# Patient Record
Sex: Female | Born: 1935
Health system: Southern US, Community
[De-identification: ages and names within clinical notes are randomized; demographics above are authoritative.]

## PROBLEM LIST (undated history)

## (undated) DIAGNOSIS — I2510 Atherosclerotic heart disease of native coronary artery without angina pectoris: Secondary | ICD-10-CM

## (undated) DIAGNOSIS — I421 Obstructive hypertrophic cardiomyopathy: Secondary | ICD-10-CM

## (undated) DIAGNOSIS — D649 Anemia, unspecified: Secondary | ICD-10-CM

## (undated) DIAGNOSIS — N289 Disorder of kidney and ureter, unspecified: Secondary | ICD-10-CM

## (undated) DIAGNOSIS — E079 Disorder of thyroid, unspecified: Secondary | ICD-10-CM

## (undated) HISTORY — PX: CARDIAC SURGERY: SHX584

## (undated) HISTORY — PX: EYE SURGERY: SHX253

## (undated) HISTORY — PX: BREAST SURGERY: SHX581

---

## 2018-10-13 ENCOUNTER — Other Ambulatory Visit: Payer: Self-pay

## 2018-10-13 ENCOUNTER — Encounter: Payer: Self-pay | Admitting: Emergency Medicine

## 2018-10-13 ENCOUNTER — Emergency Department
Admission: EM | Admit: 2018-10-13 | Discharge: 2018-10-13 | Disposition: A | Discharge status: Home / Self Care | Payer: Medicare HMO | Source: Home / Self Care

## 2018-10-13 DIAGNOSIS — R300 Dysuria: Secondary | ICD-10-CM

## 2018-10-13 DIAGNOSIS — R3129 Other microscopic hematuria: Secondary | ICD-10-CM

## 2018-10-13 HISTORY — DX: Atherosclerotic heart disease of native coronary artery without angina pectoris: I25.10

## 2018-10-13 HISTORY — DX: Disorder of kidney and ureter, unspecified: N28.9

## 2018-10-13 HISTORY — DX: Anemia, unspecified: D64.9

## 2018-10-13 HISTORY — DX: Obstructive hypertrophic cardiomyopathy: I42.1

## 2018-10-13 HISTORY — DX: Disorder of thyroid, unspecified: E07.9

## 2018-10-13 LAB — POCT URINALYSIS DIP (MANUAL ENTRY)
Bilirubin, UA: NEGATIVE
Glucose, UA: NEGATIVE mg/dL
Ketones, POC UA: NEGATIVE mg/dL
Nitrite, UA: NEGATIVE
Protein Ur, POC: NEGATIVE mg/dL
Spec Grav, UA: <=1.005 — AB (ref 1.010–1.025)
Urobilinogen, UA: 0.2 E.U./dL
pH, UA: 5 (ref 5.0–8.0)

## 2018-10-13 NOTE — Discharge Instructions (Signed)
°  Your urine sample has been sent to the lab for further testing to see if any additional bacteria grows and if antibiotics are indicated or not. Be sure to stay well hydrated.  You may try over the counter medication called Azo to help with bladder spasms.  This medication can make your urine orange, which is normal.   Please follow up with family medicine in 3-4 days if symptoms not improving, sooner if worsening.

## 2018-10-13 NOTE — ED Triage Notes (Signed)
Patient states she has some discomfort upon urination since yesterday. It does not feel like previous UTIs.  She has not taken any OTCs. She has not travelled in past 4 weeks.

## 2018-10-13 NOTE — ED Provider Notes (Signed)
[facility]    CSN: [id number] Arrival date & time: 10/13/18  1046     History   Chief Complaint Chief Complaint  Patient presents with  . Dysuria    HPI Susan Parker is a 85 y.o. female.   HPI Susan Parker is a 85 y.o. female presenting to UC with c/o mild tingling with urination that started yesterday.  Usually she has pain and frequency with UTIs but her last UTI was many years ago.  Denies fever, chills, n/v/d. Denies abdominal pain or back pain. She has not tried anything for her symptoms.    Past Medical History:  Diagnosis Date  . Anemia   . Coronary artery disease   . IHSS (idiopathic hypertrophic subaortic stenosis) (HCC)   . Renal disorder   . Thyroid disease     There are no active problems to display for this patient.   Past Surgical History:  Procedure Laterality Date  . BREAST SURGERY    . CARDIAC SURGERY    . EYE SURGERY      OB History   No obstetric history on file.      Home Medications    Prior to Admission medications   Medication Sig Start Date End Date Taking? Authorizing Provider  aspirin 81 MG chewable tablet Chew by mouth. 06/20/18 06/20/19 Yes [provider]  carvedilol (COREG) 6.25 MG tablet Take by mouth. 09/12/18  Yes [provider]  citalopram (CELEXA) 20 MG tablet  01/27/14  Yes [provider]  dronedarone (MULTAQ) 400 MG tablet TAKE 1 TABLET EVERY 12 HOURS 05/26/18  Yes [provider]  furosemide (LASIX) 40 MG tablet Take by mouth. 09/12/18  Yes [provider]  levothyroxine (SYNTHROID) 50 MCG tablet TAKE 1 TABLET EVERY DAY 01/27/14  Yes [provider]  losartan (COZAAR) 25 MG tablet  01/27/14  Yes [provider]  lovastatin (MEVACOR) 40 MG tablet TAKE 1 TABLET AT BEDTIME 09/29/18  Yes [provider]  mirtazapine (REMERON) 15 MG tablet Take one tablet (15 mg dose) by mouth at bedtime. 07/21/18  Yes [provider]  potassium chloride  (MICRO-K) 10 MEQ CR capsule Take by mouth. 02/25/14  Yes [provider]  promethazine (PHENERGAN) 12.5 MG tablet Take by mouth. 02/27/17  Yes [provider]  sacubitril-valsartan (ENTRESTO) 24-26 MG Take by mouth. 08/15/18  Yes [provider]  sertraline (ZOLOFT) 25 MG tablet Take by mouth. 09/30/18  Yes [provider]  warfarin (COUMADIN) 5 MG tablet TAKE 1 TABLET BY MOUTH DAILY 11/18/17  Yes [provider]  Cholecalciferol (VITAMIN D-1000 MAX ST) 25 MCG (1000 UT) tablet Take by mouth.    [provider]  Multiple Vitamin (MULTIVITAMIN) capsule Take by mouth.    [provider]  omeprazole (PRILOSEC) 20 MG capsule Take by mouth.    [provider]  vitamin B-12 (CYANOCOBALAMIN) 1000 MCG tablet Take by mouth.    [provider]  VITAMIN D, CHOLECALCIFEROL, PO Take by mouth.    [provider]    Family History No family history on file.  Social History Social History   Tobacco Use  . Smoking status: Not on file  Substance Use Topics  . Alcohol use: Not on file  . Drug use: Not on file     Allergies   Diltiazem; Prednisone; Ace inhibitors; Atorvastatin calcium; Diazepam; Nitroglycerin; Penicillins; Propoxyphene; Rosuvastatin calcium; Simvastatin; Diltiazem hcl; Penicillin g; Statins; and Codeine   Review of Systems Review of Systems  Physical Exam Triage Vital Signs ED Triage Vitals  Enc Vitals Group     BP 10/13/18 1120 135/77     Pulse Rate 10/13/18 1120 84     Resp 10/13/18 1120 16     Temp 10/13/18 1120 98.8 F (37.1 C)     Temp Source 10/13/18 1120 Oral     SpO2 10/13/18 1120 97 %     Weight 10/13/18 1121 112 lb (50.8 kg)     Height 10/13/18 1121 5' (1.524 m)     Head Circumference --      Peak Flow --      Pain Score 10/13/18 1121 5     Pain Loc --      Pain Edu? --      Excl. in GC? --    No data found.  Updated Vital Signs BP 135/77 (BP Location: Right Arm)    Pulse 84   Temp 98.8 F (37.1 C) (Oral)   Resp 16   Ht 5' (1.524 m)   Wt 112 lb (50.8 kg)   SpO2 97%   BMI 21.87 kg/m   Visual Acuity Right Eye Distance:   Left Eye Distance:   Bilateral Distance:    Right Eye Near:   Left Eye Near:    Bilateral Near:     Physical Exam Vitals signs and nursing note reviewed.  Constitutional:      Appearance: Normal appearance. She is well-developed.  HENT:     Head: Normocephalic and atraumatic.     Nose: Nose normal.     Mouth/Throat:     Mouth: Mucous membranes are moist.  Eyes:     Extraocular Movements: Extraocular movements intact.  Neck:     Musculoskeletal: Normal range of motion.  Cardiovascular:     Rate and Rhythm: Normal rate and regular rhythm.  Pulmonary:     Effort: Pulmonary effort is normal.     Breath sounds: Normal breath sounds.  Abdominal:     General: There is no distension.     Palpations: Abdomen is soft.     Tenderness: There is no abdominal tenderness. There is no right CVA tenderness or left CVA tenderness.  Musculoskeletal: Normal range of motion.  Skin:    General: Skin is warm and dry.  Neurological:     Mental Status: She is alert and oriented to person, place, and time.  Psychiatric:        Behavior: Behavior normal.      UC Treatments / Results  Labs (all labs ordered are listed, but only abnormal results are displayed) Labs Reviewed  POCT URINALYSIS DIP (MANUAL ENTRY) - Abnormal; Notable for the following components:      Result Value   Color, UA light yellow (*)    Spec Grav, UA <=1.005 (*)    Blood, UA moderate (*)    Leukocytes, UA Small (1+) (*)    All other components within normal limits  URINE CULTURE    EKG None  Radiology No results found.  Procedures Procedures (including critical care time)  Medications Ordered in UC Medications - No data to display  Initial Impression / Assessment and Plan / UC Course  I have reviewed the triage vital signs and the nursing  notes.  Pertinent labs & imaging results that were available during my care of the patient were reviewed by me and considered in my medical decision making (see chart for details).     Pt appears well, NAD Only small leukocytes in  UA, no nitrites. Will hold off on antibiotic and wait for urine culture to result to determine if/which antibiotic is indicated. May try OTC Azo in meantime, good hydration F/u with PCP as needed AVS provided   Final Clinical Impressions(s) / UC Diagnoses   Final diagnoses:  Dysuria  Hematuria, microscopic     Discharge Instructions      Your urine sample has been sent to the lab for further testing to see if any additional bacteria grows and if antibiotics are indicated or not. Be sure to stay well hydrated.  You may try over the counter medication called Azo to help with bladder spasms.  This medication can make your urine orange, which is normal.   Please follow up with family medicine in 3-4 days if symptoms not improving, sooner if worsening.      ED Prescriptions    None     Controlled Substance Prescriptions [place] Controlled Substance Registry consulted? Not Applicable   Rolla Plate 10/13/18 1610

## 2018-10-15 ENCOUNTER — Telehealth: Payer: Self-pay

## 2018-10-15 LAB — URINE CULTURE
MICRO NUMBER:: 504776
SPECIMEN QUALITY:: ADEQUATE

## 2018-10-15 MED ORDER — CEPHALEXIN 250 MG PO CAPS: ORAL_CAPSULE | ORAL | 0 refills | Status: DC

## 2018-10-15 NOTE — Telephone Encounter (Signed)
Urine Culture positive e. Coli, sensitive to Keflex. eGFR 45; will RX Keflex '250mg'$  Q8hr

## 2018-10-15 NOTE — Telephone Encounter (Signed)
Notified patient of positive UCX results.  Medication sent to [facility] pharmacy.

## 2021-05-24 ENCOUNTER — Emergency Department (INDEPENDENT_AMBULATORY_CARE_PROVIDER_SITE_OTHER)
Admission: EM | Admit: 2021-05-24 | Discharge: 2021-05-24 | Disposition: A | Discharge status: Home / Self Care | Payer: Medicare HMO | Source: Home / Self Care

## 2021-05-24 ENCOUNTER — Ambulatory Visit: Payer: Self-pay

## 2021-05-24 ENCOUNTER — Other Ambulatory Visit: Payer: Self-pay

## 2021-05-24 ENCOUNTER — Emergency Department (INDEPENDENT_AMBULATORY_CARE_PROVIDER_SITE_OTHER): Payer: Medicare HMO

## 2021-05-24 DIAGNOSIS — R5383 Other fatigue: Secondary | ICD-10-CM | POA: Diagnosis not present

## 2021-05-24 DIAGNOSIS — R059 Cough, unspecified: Secondary | ICD-10-CM | POA: Diagnosis not present

## 2021-05-24 DIAGNOSIS — R110 Nausea: Secondary | ICD-10-CM

## 2021-05-24 MED ORDER — ONDANSETRON 4 MG PO TBDP: 4.0000 mg | ORAL_TABLET | Freq: Three times a day (TID) | ORAL | 0 refills | Status: AC | PRN

## 2021-05-24 MED ORDER — BENZONATATE 200 MG PO CAPS: 200.0000 mg | ORAL_CAPSULE | Freq: Three times a day (TID) | ORAL | 0 refills | Status: AC | PRN

## 2021-05-24 MED ORDER — CEFDINIR 300 MG PO CAPS
300.0000 mg | ORAL_CAPSULE | Freq: Two times a day (BID) | ORAL | 0 refills | Status: AC
Start: 2021-05-24 — End: 2021-05-31

## 2021-05-24 NOTE — Discharge Instructions (Addendum)
Advised patient chest x-ray was negative for acute cardiopulmonary process.  Advised patient to take medication as directed with food to completion.  Advised patient may take OTC Claritin or Zyrtec 10 mg without D with first dose of Cefdinir for the next 5 of 7 days for concurrent postnasal drainage/drip.  Advised may use Tessalon Perles daily or as needed for cough.  Advised patient may use Zofran daily or as needed for nausea.  Medication prescriptions have been tonight per patient's request.

## 2021-05-24 NOTE — ED Provider Notes (Signed)
[facility]    CSN: [id number] Arrival date & time: 05/24/21  1647      History   Chief Complaint Chief Complaint  Patient presents with   Cough    Cough, chest congestion and fatigue. X 2weeks    HPI Kaitlynd Yoh is a 88 y.o. female.   HPI 88 year old female presents with cough, chest congestion and fatigue for 2 weeks.  PMH significant for IHSS (currently on Coumadin), anemia, CAD, and renal disorder.  Accompanied by her daughter this evening.  Additionally, patient reports intermittent nausea when coughing.  Past Medical History:  Diagnosis Date   Anemia    Coronary artery disease    IHSS (idiopathic hypertrophic subaortic stenosis) (HCC)    Renal disorder    Thyroid disease     There are no problems to display for this patient.   Past Surgical History:  Procedure Laterality Date   BREAST SURGERY     CARDIAC SURGERY     EYE SURGERY      OB History   No obstetric history on file.      Home Medications    Prior to Admission medications   Medication Sig Start Date End Date Taking? Authorizing Provider  carvedilol (COREG) 6.25 MG tablet Take by mouth. 09/12/18  Yes [provider]  cephALEXin (KEFLEX) 250 MG capsule Take one cap PO Q8 hr. 10/15/18  Yes Kandra Nicolas, MD  Cholecalciferol 25 MCG (1000 UT) tablet Take by mouth.   Yes [provider]  dicyclomine (BENTYL) 20 MG tablet Take 20 mg by mouth 3 (three) times daily as needed. 05/16/21  Yes [provider]  dronedarone (MULTAQ) 400 MG tablet TAKE 1 TABLET EVERY 12 HOURS 05/26/18  Yes [provider]  famotidine (PEPCID) 40 MG tablet Take by mouth. 12/28/20  Yes [provider]  furosemide (LASIX) 40 MG tablet Take by mouth. 09/12/18  Yes [provider]  levothyroxine (SYNTHROID) 75 MCG tablet Take 1 tablet by mouth at bedtime. 05/26/20  Yes [provider]  losartan (COZAAR) 25 MG tablet  01/27/14  Yes [provider]   lovastatin (MEVACOR) 40 MG tablet TAKE 1 TABLET AT BEDTIME 09/29/18  Yes [provider]  metoprolol (TOPROL-XL) 200 MG 24 hr tablet Take 200 mg by mouth daily. 05/08/21  Yes [provider]  mirtazapine (REMERON) 15 MG tablet Take one tablet (15 mg dose) by mouth at bedtime. 07/21/18  Yes [provider]  Multiple Vitamin (MULTIVITAMIN) capsule Take by mouth.   Yes [provider]  Omega-3 Fatty Acids (FISH OIL) 1000 MG CAPS Take by mouth.   Yes [provider]  omeprazole (PRILOSEC) 20 MG capsule Take by mouth.   Yes [provider]  potassium chloride (MICRO-K) 10 MEQ CR capsule Take by mouth. 02/25/14  Yes [provider]  promethazine (PHENERGAN) 12.5 MG tablet Take by mouth. 02/27/17  Yes [provider]  sacubitril-valsartan (ENTRESTO) 24-26 MG Take by mouth. 08/15/18  Yes [provider]  sacubitril-valsartan (ENTRESTO) 24-26 MG Take by mouth. 11/29/20  Yes [provider]  sertraline (ZOLOFT) 100 MG tablet Take 1 tablet by mouth daily. 02/27/21  Yes [provider]  sertraline (ZOLOFT) 25 MG tablet Take by mouth. 09/30/18  Yes [provider]  spironolactone (ALDACTONE) 25 MG tablet Take 1 tablet by mouth daily. 01/25/21  Yes [provider]  vitamin B-12 (CYANOCOBALAMIN) 1000 MCG tablet Take by mouth.   Yes [provider]  VITAMIN D,  CHOLECALCIFEROL, PO Take by mouth.   Yes [provider]  warfarin (COUMADIN) 5 MG tablet TAKE 1 TABLET BY MOUTH DAILY 11/18/17  Yes [provider]  citalopram (CELEXA) 20 MG tablet  01/27/14   [provider]  levothyroxine (SYNTHROID) 50 MCG tablet TAKE 1 TABLET EVERY DAY 01/27/14   [provider]    Family History History reviewed. No pertinent family history.  Social History Social History   Tobacco Use   Smoking status: Never   Smokeless tobacco: Never     Allergies   Diltiazem, Prednisone,  Ace inhibitors, Atorvastatin calcium, Diazepam, Nitroglycerin, Penicillins, Propoxyphene, Rosuvastatin calcium, Simvastatin, Diltiazem hcl, Penicillin g, Statins, and Codeine   Review of Systems Review of Systems  Constitutional:  Positive for fatigue.  HENT:  Positive for congestion.   Respiratory:  Positive for cough.   All other systems reviewed and are negative.   Physical Exam Triage Vital Signs ED Triage Vitals  Enc Vitals Group     BP 05/24/21 1742 110/67     Pulse Rate 05/24/21 1742 73     Resp 05/24/21 1742 16     Temp 05/24/21 1742 97.9 F (36.6 C)     Temp Source 05/24/21 1742 Oral     SpO2 05/24/21 1747 99 %     Weight 05/24/21 1739 91 lb (41.3 kg)     Height 05/24/21 1739 5' (1.524 m)     Head Circumference --      Peak Flow --      Pain Score 05/24/21 1739 0     Pain Loc --      Pain Edu? --      Excl. in [place]? --    No data found.  Updated Vital Signs BP 110/67 (BP Location: Right Arm)    Pulse 73    Temp 97.9 F (36.6 C) (Oral)    Resp 16    Ht 5' (1.524 m)    Wt 91 lb (41.3 kg)    SpO2 99%    BMI 17.77 kg/m   Physical Exam Vitals and nursing note reviewed.  Constitutional:      General: She is not in acute distress.    Appearance: Normal appearance. She is not ill-appearing.     Comments: Very thin in stature  HENT:     Right Ear: Tympanic membrane, ear canal and external ear normal.     Left Ear: Tympanic membrane, ear canal and external ear normal.     Mouth/Throat:     Mouth: Mucous membranes are moist.     Pharynx: Oropharynx is clear.  Eyes:     Extraocular Movements: Extraocular movements intact.     Conjunctiva/sclera: Conjunctivae normal.     Pupils: Pupils are equal, round, and reactive to light.  Cardiovascular:     Rate and Rhythm: Normal rate and regular rhythm.     Pulses: Normal pulses.     Heart sounds: Normal heart sounds.  Pulmonary:     Comments: Diminished breath sounds noted throughout. Musculoskeletal:     Cervical back:  No tenderness.  Lymphadenopathy:     Cervical: No cervical adenopathy.  Skin:    General: Skin is warm and dry.  Neurological:     General: No focal deficit present.     Mental Status: She is alert and oriented to person, place, and time.     UC Treatments / Results  Labs (all labs ordered are listed, but only abnormal results are displayed) Labs Reviewed -  No data to display  EKG   Radiology DG Chest 2 View  Result Date: 05/24/2021 CLINICAL DATA:  Diminished breath sounds, cough. EXAM: CHEST - 2 VIEW FINDINGS: Left-sided ICD is present. The heart is enlarged, unchanged. The lungs are clear there is no pleural effusion or pneumothorax. No acute fractures are seen. IMPRESSION: 1. Cardiomegaly. 2. No acute cardiopulmonary process. Electronically Signed   By: Ronney Asters M.D.   On: 05/24/2021 18:44    Procedures Procedures (including critical care time)  Medications Ordered in UC Medications - No data to display  Initial Impression / Assessment and Plan / UC Course  I have reviewed the triage vital signs and the nursing notes.  Pertinent labs & imaging results that were available during my care of the patient were reviewed by me and considered in my medical decision making (see chart for details).     MDM: 1. Cough-CXR revealed above, cardiomegaly, no acute cardiopulmonary process. Advised patient chest x-ray was negative for acute cardiopulmonary process.  Advised patient to take medication as directed with food to completion.  Advised patient may take OTC Claritin or Zyrtec 10 mg without D with first dose of Cefdinir for the next 5 of 7 days for concurrent postnasal drainage/drip.  Advised may use Tessalon Perles daily or as needed for cough.  Advised patient may use Zofran daily or as needed for nausea.  Medication prescriptions have been tonight per patient's request.  Patient discharged home, hemodynamically stable. Final Clinical Impressions(s) / UC Diagnoses   Final  diagnoses:  Cough, unspecified type  Fatigue, unspecified type   Discharge Instructions   None    ED Prescriptions   None    PDMP not reviewed this encounter.   Eliezer Lofts, [place] 05/24/21 1930

## 2021-05-24 NOTE — ED Triage Notes (Signed)
Pt states that she has a cough, chest congestion and fatigue. X2 weeks  Pt states that she is vaccinated for covid. Pt states that she has had flu vaccine.

## 2021-08-28 ENCOUNTER — Encounter: Payer: Self-pay | Admitting: Emergency Medicine

## 2021-08-28 ENCOUNTER — Emergency Department
Admission: EM | Admit: 2021-08-28 | Discharge: 2021-08-28 | Disposition: A | Discharge status: Home / Self Care | Payer: Medicare HMO | Source: Home / Self Care | Attending: Family Medicine | Admitting: Family Medicine

## 2021-08-28 DIAGNOSIS — W19XXXA Unspecified fall, initial encounter: Secondary | ICD-10-CM | POA: Diagnosis not present

## 2021-08-28 DIAGNOSIS — S51819A Laceration without foreign body of unspecified forearm, initial encounter: Secondary | ICD-10-CM

## 2021-08-28 DIAGNOSIS — Y92009 Unspecified place in unspecified non-institutional (private) residence as the place of occurrence of the external cause: Secondary | ICD-10-CM

## 2021-08-28 MED ORDER — LIDOCAINE-EPINEPHRINE-TETRACAINE (LET) TOPICAL GEL
3.0000 mL | Freq: Once | TOPICAL | Status: AC
  Administered 2021-08-28: 3 mL via TOPICAL

## 2021-08-28 MED ORDER — LIDOCAINE-EPINEPHRINE-TETRACAINE (LET) TOPICAL GEL: 3.0000 mL | Freq: Once | TOPICAL | Status: DC

## 2021-08-28 NOTE — ED Triage Notes (Signed)
Pt states she was walking down hall and bumped her arm on wall and has a laceration to her left forearm. This happened about 2 hour ago. She is on blood thinners. Dressing in place.  ?

## 2021-08-28 NOTE — Discharge Instructions (Signed)
Keep area clean and dry ?Watch for infection ?Protect tapes for 5-7 days ?Remove gently ?Re apply if needed ?

## 2021-08-28 NOTE — ED Provider Notes (Signed)
?[facility] ? ? ? ?CSN: [id number] ?Arrival date & time: 08/28/21  1800 ? ? ?  ? ?History   ?Chief Complaint ?Chief Complaint  ?Patient presents with  ? Extremity Laceration  ? ? ?HPI ?Susan Parker is a 88 y.o. female.  ? ?HPI ? ?Patient took off walking without her walker had some items in her hand, lost her balance and fell backwards.  Hit her right arm on furniture.  Here for a skin tear.  Did not have a head injury or loss of consciousness, any headache or other symptoms ? ?Past Medical History:  ?Diagnosis Date  ? Anemia   ? Coronary artery disease   ? IHSS (idiopathic hypertrophic subaortic stenosis) (HCC)   ? Renal disorder   ? Thyroid disease   ? ? ?There are no problems to display for this patient. ? ? ?Past Surgical History:  ?Procedure Laterality Date  ? BREAST SURGERY    ? CARDIAC SURGERY    ? EYE SURGERY    ? ? ?OB History   ?No obstetric history on file. ?  ? ? ? ?Home Medications   ? ?Prior to Admission medications   ?Medication Sig Start Date End Date Taking? Authorizing Provider  ?carvedilol (COREG) 6.25 MG tablet Take by mouth. 09/12/18   [provider]  ?Cholecalciferol 25 MCG (1000 UT) tablet Take by mouth.    [provider]  ?citalopram (CELEXA) 20 MG tablet  01/27/14   [provider]  ?dicyclomine (BENTYL) 20 MG tablet Take 20 mg by mouth 3 (three) times daily as needed. 05/16/21   [provider]  ?dronedarone (MULTAQ) 400 MG tablet TAKE 1 TABLET EVERY 12 HOURS 05/26/18   [provider]  ?famotidine (PEPCID) 40 MG tablet Take by mouth. 12/28/20   [provider]  ?furosemide (LASIX) 40 MG tablet Take by mouth. 09/12/18   [provider]  ?levothyroxine (SYNTHROID) 50 MCG tablet TAKE 1 TABLET EVERY DAY 01/27/14   [provider]  ?levothyroxine (SYNTHROID) 75 MCG tablet Take 1 tablet by mouth at bedtime. 05/26/20   [provider]  ?losartan (COZAAR) 25 MG tablet  01/27/14   [provider]  ?lovastatin  (MEVACOR) 40 MG tablet TAKE 1 TABLET AT BEDTIME 09/29/18   [provider]  ?metoprolol (TOPROL-XL) 200 MG 24 hr tablet Take 200 mg by mouth daily. 05/08/21   [provider]  ?mirtazapine (REMERON) 15 MG tablet Take one tablet (15 mg dose) by mouth at bedtime. 07/21/18   [provider]  ?Multiple Vitamin (MULTIVITAMIN) capsule Take by mouth.    [provider]  ?Omega-3 Fatty Acids (FISH OIL) 1000 MG CAPS Take by mouth.    [provider]  ?omeprazole (PRILOSEC) 20 MG capsule Take by mouth.    [provider]  ?ondansetron (ZOFRAN-ODT) 4 MG disintegrating tablet Take 1 tablet (4 mg total) by mouth every 8 (eight) hours as needed for nausea or vomiting. 05/24/21   Trevor Iha, FNP  ?potassium chloride (MICRO-K) 10 MEQ CR capsule Take by mouth. 02/25/14   [provider]  ?promethazine (PHENERGAN) 12.5 MG tablet Take by mouth. 02/27/17   [provider]  ?sacubitril-valsartan (ENTRESTO) 24-26 MG Take by mouth. 08/15/18   [provider]  ?sacubitril-valsartan (ENTRESTO) 24-26 MG Take by mouth. 11/29/20   [provider]  ?sertraline (ZOLOFT) 100 MG tablet Take 1 tablet by mouth daily. 02/27/21   [provider]  ?sertraline (ZOLOFT) 25 MG tablet Take by mouth. 09/30/18  [provider]  ?spironolactone (ALDACTONE) 25 MG tablet Take 1 tablet by mouth daily. 01/25/21   [provider]  ?vitamin B-12 (CYANOCOBALAMIN) 1000 MCG tablet Take by mouth.    [provider]  ?VITAMIN D, CHOLECALCIFEROL, PO Take by mouth.    [provider]  ?warfarin (COUMADIN) 5 MG tablet TAKE 1 TABLET BY MOUTH DAILY 11/18/17   [provider]  ? ? ?Family History ?History reviewed. No pertinent family history. ? ?Social History ?Social History  ? ?Tobacco Use  ? Smoking status: Never  ? Smokeless tobacco: Never  ? ? ? ?Allergies   ?Diltiazem, Prednisone, Ace inhibitors, Atorvastatin calcium, Diazepam,  Nitroglycerin, Penicillins, Propoxyphene, Rosuvastatin calcium, Simvastatin, Diltiazem hcl, Penicillin g, Statins, and Codeine ? ? ?Review of Systems ?Review of Systems ? ?See HPI ?Physical Exam ?Triage Vital Signs ?ED Triage Vitals  ?Enc Vitals Group  ?   BP 08/28/21 1824 (!) 141/86  ?   Pulse Rate 08/28/21 1824 90  ?   Resp 08/28/21 1824 18  ?   Temp 08/28/21 1824 97.9 ?F (36.6 ?C)  ?   Temp Source 08/28/21 1824 Oral  ?   SpO2 08/28/21 1824 96 %  ?   Weight --   ?   Height --   ?   Head Circumference --   ?   Peak Flow --   ?   Pain Score 08/28/21 1827 0  ?   Pain Loc --   ?   Pain Edu? --   ?   Excl. in GC? --   ? ?No data found. ? ?Updated Vital Signs ?BP (!) 141/86 (BP Location: Right Arm)   Pulse 90   Temp 97.9 ?F (36.6 ?C) (Oral)   Resp 18   SpO2 96%  ? ?   ? ?Physical Exam ?Constitutional:   ?   General: She is not in acute distress. ?   Appearance: Normal appearance. She is well-developed.  ?   Comments: Appears small and frail.  Stooped posture.  Small steps.  Balance poor.  Assisted by daughter  ?HENT:  ?   Head: Normocephalic and atraumatic.  ?Eyes:  ?   Conjunctiva/sclera: Conjunctivae normal.  ?   Pupils: Pupils are equal, round, and reactive to light.  ?Cardiovascular:  ?   Rate and Rhythm: Normal rate.  ?Pulmonary:  ?   Effort: Pulmonary effort is normal. No respiratory distress.  ?Abdominal:  ?   General: There is no distension.  ?   Palpations: Abdomen is soft.  ?Musculoskeletal:     ?   General: Normal range of motion.  ?   Cervical back: Normal range of motion.  ?Skin: ?   General: Skin is warm and dry.  ?   Comments: The left forearm has a single skin tear that is approximately 6 cm long.  It is cleansed.  Soak with LAT.  Closed with Steri-Strips.  Good results  ?Neurological:  ?   Mental Status: She is alert.  ?Psychiatric:     ?   Mood and Affect: Mood normal.     ?   Behavior: Behavior normal.  ? ? ? ?UC Treatments / Results  ?Labs ?(all labs ordered are listed, but only abnormal results  are displayed) ?Labs Reviewed - No data to display ? ?EKG ? ? ?Radiology ?No results found. ? ?Procedures ?Procedures (including critical care time) ? ?Medications Ordered in UC ?Medications  ?lidocaine-EPINEPHrine-tetracaine (LET) topical gel (3 mLs Topical Given 08/28/21 1854)  ? ? ?  Initial Impression / Assessment and Plan / UC Course  ?I have reviewed the triage vital signs and the nursing notes. ? ?Pertinent labs & imaging results that were available during my care of the patient were reviewed by me and considered in my medical decision making (see chart for details). ? ?  ? ?Final Clinical Impressions(s) / UC Diagnoses  ? ?Final diagnoses:  ?Skin tear of forearm without complication, initial encounter  ?Fall at home, initial encounter  ? ? ? ?Discharge Instructions   ? ?  ?Keep area clean and dry ?Watch for infection ?Protect tapes for 5-7 days ?Remove gently ?Re apply if needed ? ? ?ED Prescriptions   ?None ?  ? ?PDMP not reviewed this encounter. ?  ?Eustace Moore, MD ?08/28/21 1955 ? ?

## 2022-06-27 IMAGING — DX DG CHEST 2V
2 series · 2 of 2 positions shown · non-contrast
Comparison: none

CLINICAL DATA: Diminished breath sounds, cough.

EXAM:
CHEST - 2 VIEW

[chest pa]
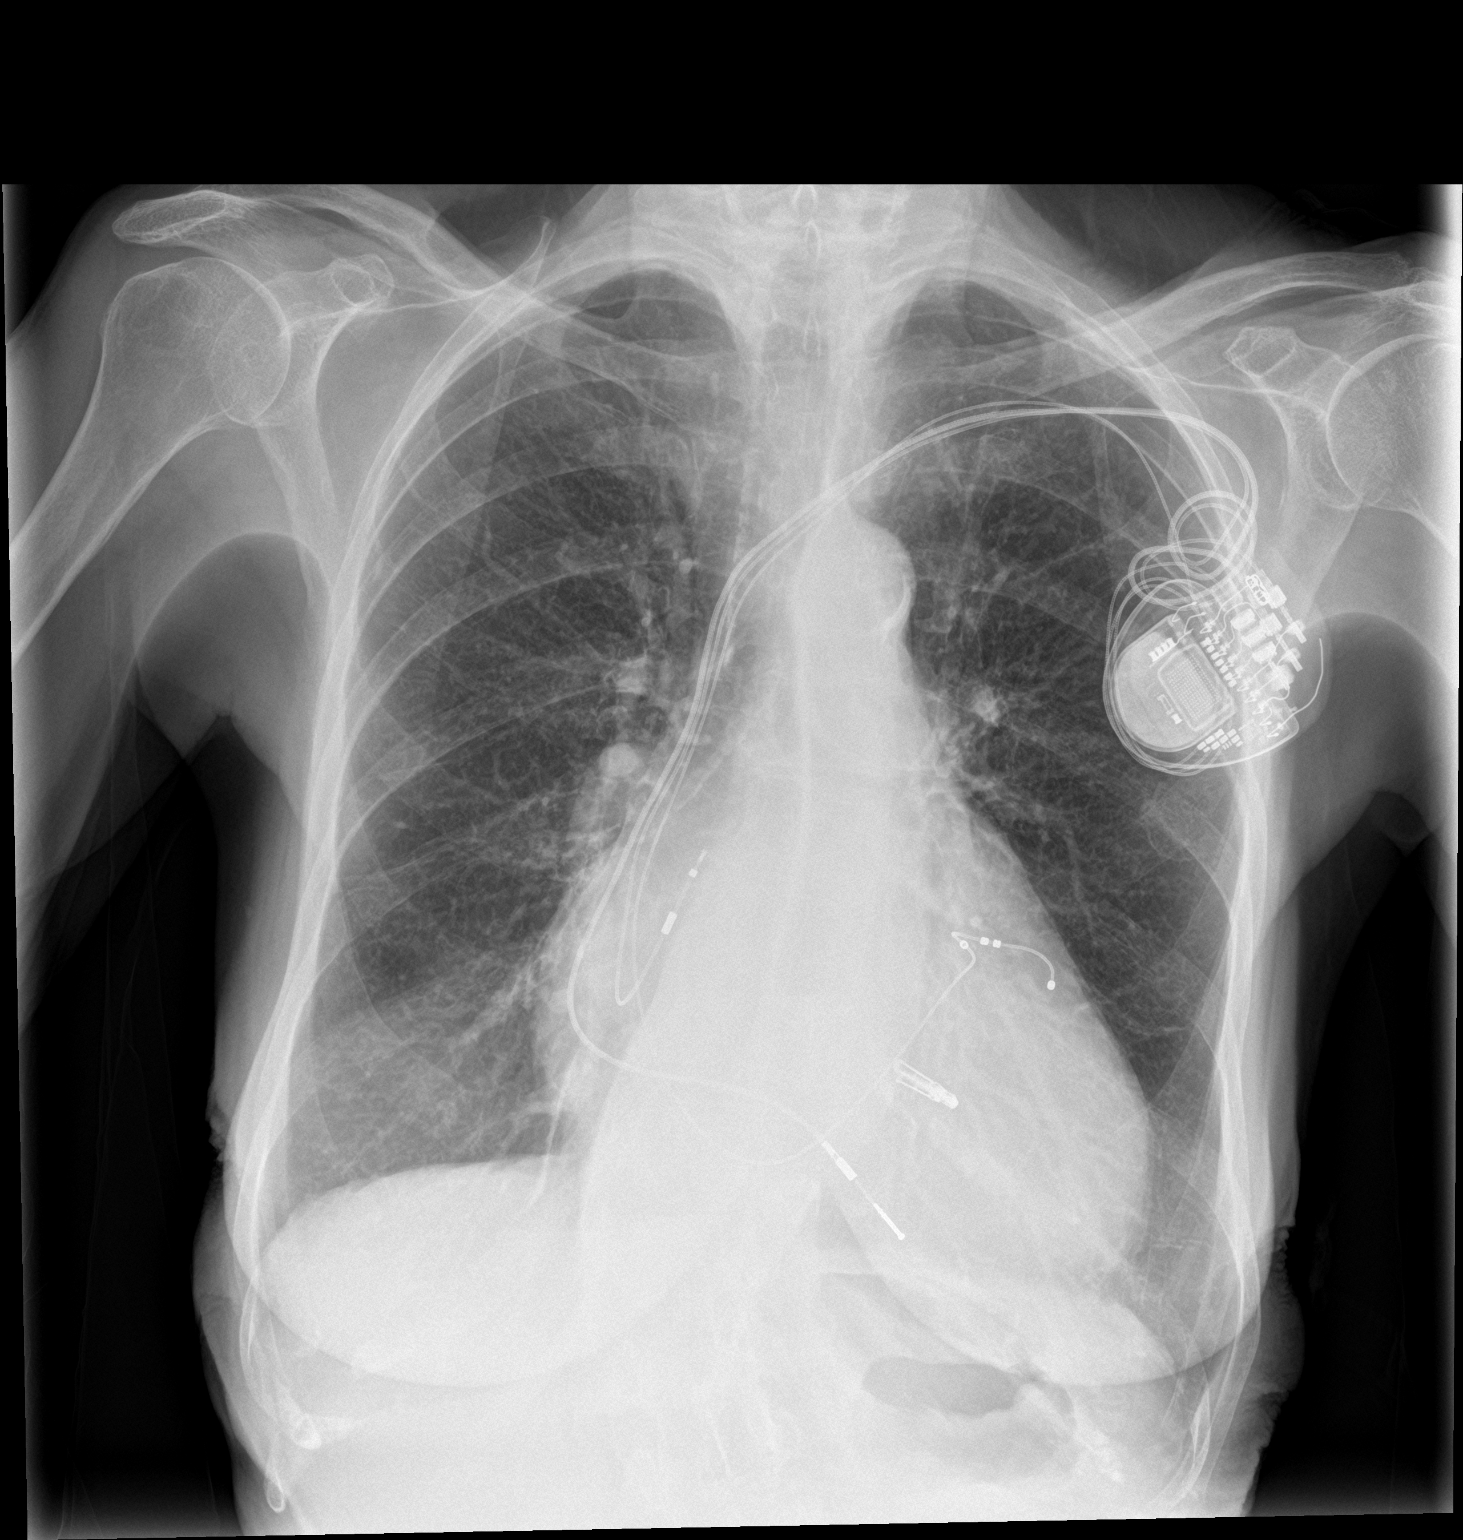

[chest lat]
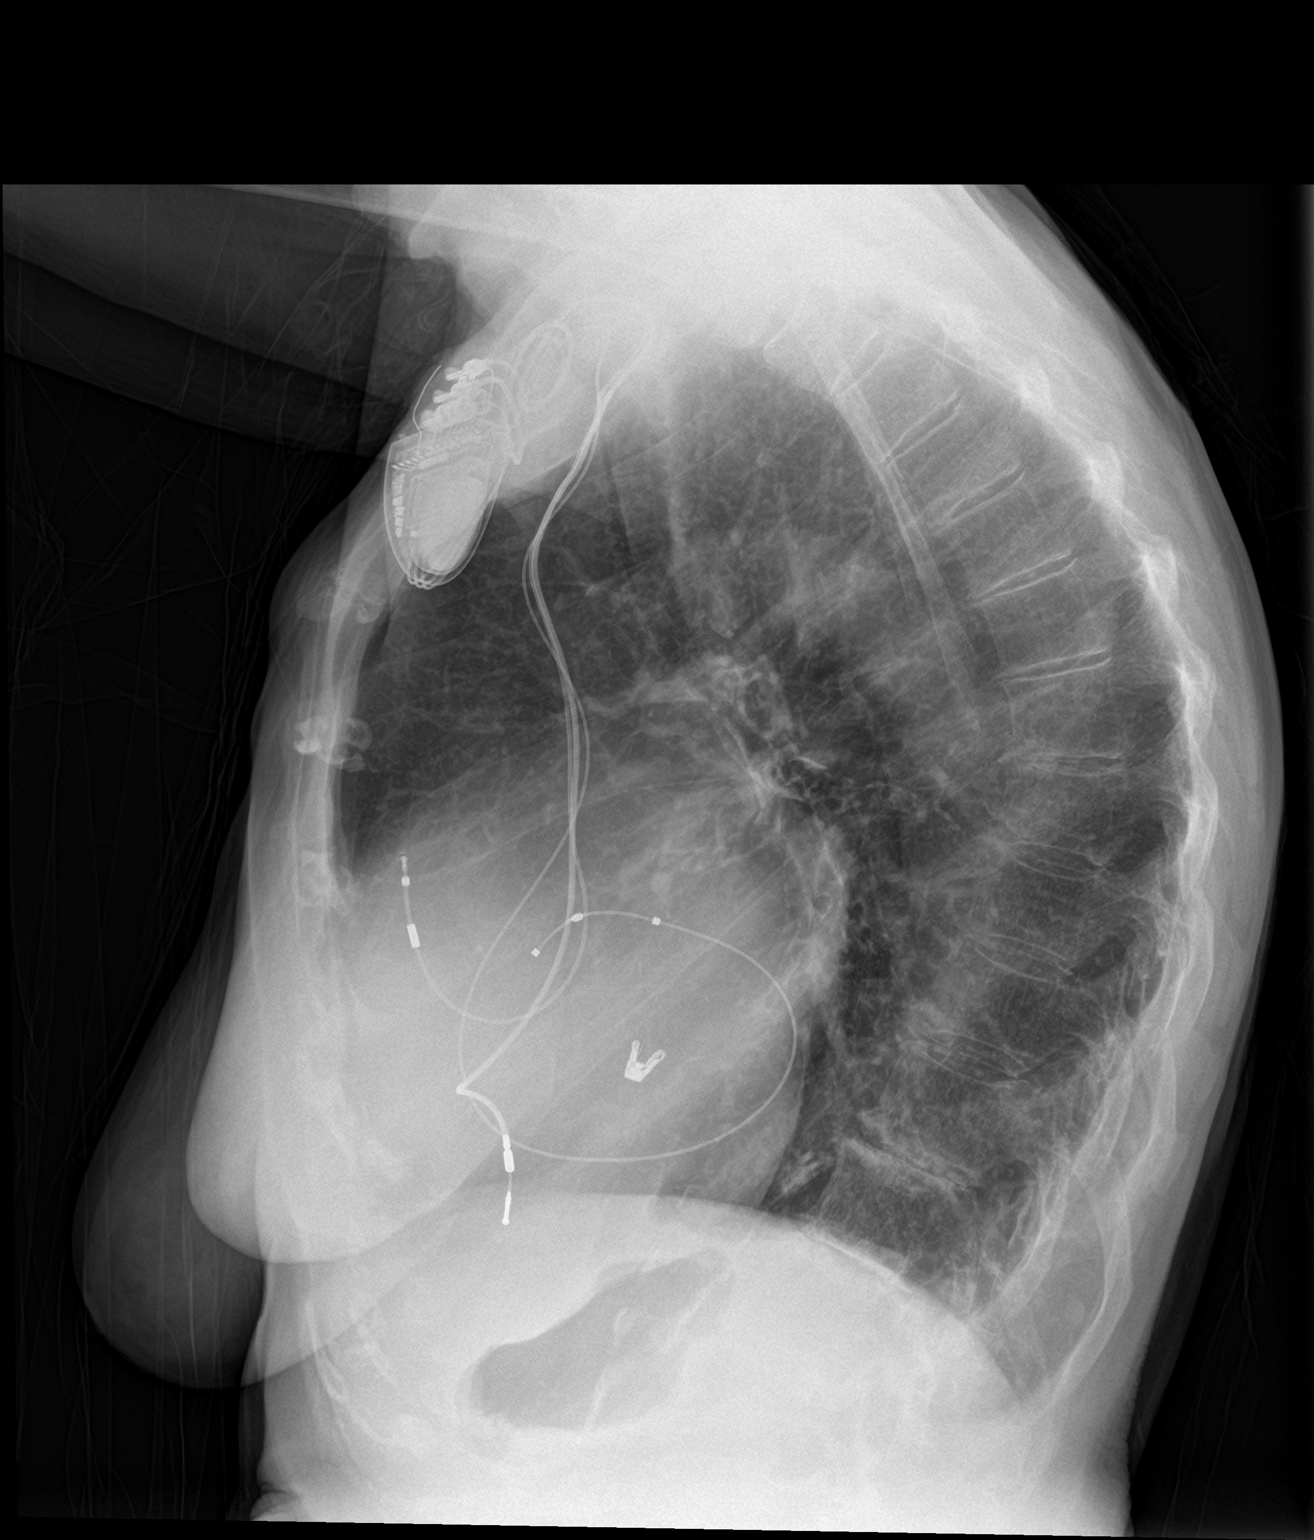

[2 of 2 positions shown; findings below may reference images not displayed]

FINDINGS: Left-sided ICD is present. The heart is enlarged, unchanged. The
lungs are clear there is no pleural effusion or pneumothorax. No
acute fractures are seen.
IMPRESSION: 1. Cardiomegaly.
2. No acute cardiopulmonary process.
